# Patient Record
Sex: Male | Born: 1965 | Race: White | Hispanic: No | Marital: Married | Smoking: Never smoker
Health system: Southern US, Community
[De-identification: ages and names within clinical notes are randomized; demographics above are authoritative.]

---

## 2001-06-13 ENCOUNTER — Encounter: Payer: Self-pay | Admitting: Family Medicine

## 2001-06-13 ENCOUNTER — Ambulatory Visit (HOSPITAL_COMMUNITY): Admission: RE | Admit: 2001-06-13 | Discharge: 2001-06-13 | Payer: Self-pay | Admitting: Family Medicine

## 2003-08-14 ENCOUNTER — Ambulatory Visit (HOSPITAL_COMMUNITY): Admission: RE | Admit: 2003-08-14 | Discharge: 2003-08-14 | Payer: Self-pay | Admitting: Family Medicine

## 2003-08-14 ENCOUNTER — Encounter: Payer: Self-pay | Admitting: Family Medicine

## 2003-08-15 ENCOUNTER — Encounter: Payer: Self-pay | Admitting: Emergency Medicine

## 2003-08-16 ENCOUNTER — Inpatient Hospital Stay (HOSPITAL_COMMUNITY): Admission: AD | Admit: 2003-08-16 | Discharge: 2003-08-19 | Payer: Self-pay | Admitting: *Deleted

## 2003-08-18 ENCOUNTER — Encounter: Payer: Self-pay | Admitting: Internal Medicine

## 2007-07-10 ENCOUNTER — Emergency Department (HOSPITAL_COMMUNITY): Admission: EM | Admit: 2007-07-10 | Discharge: 2007-07-10 | Payer: Self-pay | Admitting: Emergency Medicine

## 2008-04-08 ENCOUNTER — Ambulatory Visit (HOSPITAL_COMMUNITY): Admission: RE | Admit: 2008-04-08 | Discharge: 2008-04-08 | Payer: Self-pay | Admitting: Family Medicine

## 2008-05-29 ENCOUNTER — Ambulatory Visit (HOSPITAL_COMMUNITY): Admission: RE | Admit: 2008-05-29 | Discharge: 2008-05-30 | Payer: Self-pay | Admitting: Neurosurgery

## 2008-06-22 ENCOUNTER — Ambulatory Visit (HOSPITAL_COMMUNITY): Admission: RE | Admit: 2008-06-22 | Discharge: 2008-06-22 | Payer: Self-pay | Admitting: Neurosurgery

## 2010-12-21 ENCOUNTER — Ambulatory Visit (HOSPITAL_COMMUNITY)
Admission: RE | Admit: 2010-12-21 | Discharge: 2010-12-21 | Payer: Self-pay | Source: Home / Self Care | Attending: Family Medicine | Admitting: Family Medicine

## 2011-01-14 ENCOUNTER — Encounter: Payer: Self-pay | Admitting: Orthopedic Surgery

## 2011-05-09 NOTE — Op Note (Signed)
NAMESHANON, BECVAR               ACCOUNT NO.:  [id number]   MEDICAL RECORD NO.:  [id number]          PATIENT TYPE:  OIB   LOCATION:  3528                         FACILITY:  [facility]   PHYSICIAN:  Hilda Lias, M.D.   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   PROCEDURE:  Right L5-S1 laminotomy, foraminotomy, diskectomy,  decompression of the S1 nerve root.  Microscope.   SURGEON:  Hilda Lias, MD.   ASSISTANT:  Cristi Loron, MD   CLINICAL HISTORY:  The patient was admitted because of back and right  leg pain.  The patient has failed every single conservative treatment.  Surgery was advised and the risks were explained to him and his wife.   PROCEDURE IN DETAIL:  The patient was taken to the OR.  He was  positioned in a prone manner.  X-rays showed the needle was quite below  the L5-S1 space.  The skin was cleaned with DuraPrep and midline  incision was made with muscle retracted to the right side.  Further x-  rays showed that indeed we were at the level of L5-S1.  With the  microscope, we drilled the lamina of L5 and the upper part of S1.  The  patient has a thick yellow ligament.  This was also excised.  Immediately, we found this S1 nerve root with placement of Depo-Medrol  solution from the plate.  The nerve root was swollen and it was very  attached to the floor and to the herniated disk.  Retraction was made  and immediately we excised a large free fragment.  There was an opening  in the disk space and we were able to dissipate.  Although, this  gentleman is 42 years, he has the consistency of a juvenile disk.  Removal of this loose fragment medial and lateral was achieved.  At the  end, we had good decompression of the  L5-S1 nerve root.  Valsalva  maneuver was negative.  Depo-Medrol and fentanyl were left in the  epidural space and the wound was closed with Vicryl and Steri-Strips.           ______________________________  Hilda Lias, M.D.     EB/MEDQ  D:  05/29/2008  T:  05/30/2008  Job:  [id number]

## 2011-05-09 NOTE — H&P (Signed)
NAMENEVAN, CREIGHTON               ACCOUNT NO.:  [id number]   MEDICAL RECORD NO.:  [id number]          PATIENT TYPE:  OIB   LOCATION:  3528                         FACILITY:  [facility]   PHYSICIAN:  Hilda Lias, M.D.   DATE OF BIRTH:  July 31, 1966   DATE OF ADMISSION:  05/29/2008  DATE OF DISCHARGE:                              HISTORY & PHYSICAL   Mr. Ryan Nichols is a gentleman who had been seen in my office on several  occasions complaining of back pain, radiates down to the right leg,  which is getting worse lately.  The patient had conservative treatments.  He is not any better.  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  He failed conservative  treatment, and he wanted to proceed with surgery.   PAST MEDICAL HISTORY:  Negative.   PAST SURGERIES:  Negative.   He is not allergic to any medication.   REVIEW OF SYSTEMS:  Positive for back and right leg pain.   PHYSICAL EXAMINATION:  The patient came to my office with his wife.  He  was limping on the right leg.  HEAD, EARS, NOSE, AND THROAT:  Normal.  NECK:  Normal.  LUNGS:  Clear.  HEART:  Sounds normal.  ABDOMEN:  Normal.  EXTREMITIES:  Normal pulses.  NEUROLOGIC:  He has absent right ankle jerk.  He has a weakness of  dorsiflexion to right foot 4/5 with positive plantar flexion.  On distal  straight leg raising, is positive at about 45 degrees.    MRI showed a herniated disk at the level L5-S1 central and to the right  side.   CLINICAL IMPRESSION:  Right L5-S1 herniated disk with an incidental  foraminal narrowing at the L4-5 on the left.   RECOMMENDATIONS:  The patient wanted to proceed with surgery.  The  patient asked me about doing not only the right L5-S1 herniated disk,  but also to take care of the left side L4-5.  He told that since he did  have no  symptoms on the left side, we better stay away from  any procedure in  that area, which is not producing him any pain.  The procedure will be  right L5-S1 diskectomy.  The risks were fully explained to him including  the possibility of infection, CSF leak, worsening pain, paralysis, and  need for further surgery.           ______________________________  Hilda Lias, M.D.     EB/MEDQ  D:  05/29/2008  T:  05/30/2008  Job:  [id number]

## 2011-09-21 LAB — CBC
MCV: 90.5
RBC: 4.87
WBC: 6.3

## 2011-10-09 LAB — COMPREHENSIVE METABOLIC PANEL
ALT: 23
AST: 29
Albumin: 4.1
CO2: 26
Calcium: 9.2
Chloride: 108
GFR calc Af Amer: >60
GFR calc non Af Amer: >60
Sodium: 138
Total Bilirubin: 1.2

## 2011-10-09 LAB — DIFFERENTIAL
Basophils Absolute: 0
Basophils Relative: 0
Eosinophils Absolute: 0.2
Eosinophils Relative: 3
Lymphocytes Relative: 15
Lymphs Abs: 0.9
Monocytes Absolute: 0.3
Monocytes Relative: 4
Neutro Abs: 4.6
Neutrophils Relative %: 78 — ABNORMAL HIGH

## 2011-10-09 LAB — COMPREHENSIVE METABOLIC PANEL WITH GFR
Alkaline Phosphatase: 64
BUN: 14
Creatinine, Ser: 1.26
Glucose, Bld: 134 — ABNORMAL HIGH
Potassium: 3.8
Total Protein: 6.4

## 2011-10-09 LAB — URINALYSIS, ROUTINE W REFLEX MICROSCOPIC
Bilirubin Urine: NEGATIVE
Glucose, UA: NEGATIVE
Ketones, ur: 15 — AB
Leukocytes, UA: NEGATIVE
Nitrite: NEGATIVE
Protein, ur: NEGATIVE
Specific Gravity, Urine: >1.03 — ABNORMAL HIGH
Urobilinogen, UA: 0.2
pH: 5.5

## 2011-10-09 LAB — LIPASE, BLOOD: Lipase: 27

## 2011-10-09 LAB — CBC
HCT: 39.2
Hemoglobin: 13.4
MCHC: 34.3
MCV: 90.3
Platelets: 243
RBC: 4.34
RDW: 12.2
WBC: 5.9

## 2011-10-09 LAB — URINE MICROSCOPIC-ADD ON

## 2017-06-20 ENCOUNTER — Ambulatory Visit (INDEPENDENT_AMBULATORY_CARE_PROVIDER_SITE_OTHER): Payer: BC Managed Care – PPO | Admitting: Family Medicine

## 2017-06-20 ENCOUNTER — Encounter: Payer: Self-pay | Admitting: Family Medicine

## 2017-06-20 VITALS — BP 118/84 | Temp 98.6°F | Ht 72.0 in | Wt 209.6 lb

## 2017-06-20 DIAGNOSIS — J329 Chronic sinusitis, unspecified: Secondary | ICD-10-CM

## 2017-06-20 DIAGNOSIS — J208 Acute bronchitis due to other specified organisms: Secondary | ICD-10-CM | POA: Diagnosis not present

## 2017-06-20 DIAGNOSIS — J310 Chronic rhinitis: Secondary | ICD-10-CM

## 2017-06-20 MED ORDER — DOXYCYCLINE HYCLATE 100 MG PO TABS: 100.0000 mg | ORAL_TABLET | Freq: Two times a day (BID) | ORAL | 0 refills | Status: DC

## 2017-06-20 MED ORDER — HYDROCODONE-HOMATROPINE 5-1.5 MG/5ML PO SYRP: 5.0000 mL | ORAL_SOLUTION | Freq: Every evening | ORAL | 0 refills | Status: DC | PRN

## 2017-06-20 NOTE — Progress Notes (Signed)
   Subjective:    Patient ID: Ryan Nichols, male    DOB: 12/26/1965, 51 y.o.   MRN: [id number]  Cough  This is a new problem. The current episode started in the past 7 days. Associated symptoms include a fever, headaches, nasal congestion and a sore throat. Associated symptoms comments: Abdominal pain. Treatments tried: allergy meds.   Pt d3veloped cough pretty severe  Dim energy  Wiped out  Felt feveriish   Throat inflammed all day  Non productive for everything using alergy meds      Review of Systems  Constitutional: Positive for fever.  HENT: Positive for sore throat.   Respiratory: Positive for cough.   Neurological: Positive for headaches.       Objective:   Physical Exam Alert, mild malaise. Hydration good Vitals stable. frontal/ maxillary tenderness evident positive nasal congestion. pharynx normal neck supple  lungs clear/no crackles or wheezes. heart regular in rhythm  Left lateral and lower abdominal tenderness to deep palpation. Excellent bowel sounds no discrete masses no guarding no rebound pain worse with movement and coughing      Assessment & Plan:  Impression rhinosinusitis likely post viral, discussed with patient. plan antibiotics prescribed. Questions answered. Symptomatic care discussed. warning signs discussed.Also likely element of bronchitis and abdominal strain from all of the coughing. All of this likely represents December time viral syndrome with potentially secondary bacterial infection. Doubt tickborne illness but will cover discussed WSL also strongly encouraged yearly preventive checkups

## 2017-06-22 ENCOUNTER — Telehealth: Payer: Self-pay | Admitting: Family Medicine

## 2017-06-22 MED ORDER — BENZONATATE 100 MG PO CAPS: 100.0000 mg | ORAL_CAPSULE | Freq: Three times a day (TID) | ORAL | 0 refills | Status: DC | PRN

## 2017-06-22 NOTE — Telephone Encounter (Signed)
Spoke with patient and patient stated that hycodan is not helping his cough and it is not helping him rest. He is still coughing really hard during the night. He also has concerns of strained feeling that feels like a pulled muscle to the abdomen that patient feels is associated with cough. Please advise?

## 2017-06-22 NOTE — Telephone Encounter (Signed)
Patient was prescribed hycodan syrup to help him sleep on 06/20/17 by Dr. Brett CanalesSteve.  He is having an adverse reaction and it is actually keeping him awake.  Please advise.

## 2017-06-22 NOTE — Telephone Encounter (Signed)
Off medication is as strong as it gets. May add Tessalon 100 mg 1 3 times a day when necessary cough, #21. If the patient is having shortness of breath fever chills may need to have x-ray if worse will need follow-up

## 2017-06-22 NOTE — Telephone Encounter (Signed)
Spoke with patient's wife and informed her per Dr.Scott Luking- As far as cough medication Hycodan is as strong as it gets. We may add Tessalon perles 100 mg by mouth 1 3 times a day when necessary for cough . If he has SOB, Fever, chills may need to have xray if worse will need to follow up. Patient's wife verbalized understanding.

## 2017-06-26 ENCOUNTER — Ambulatory Visit (INDEPENDENT_AMBULATORY_CARE_PROVIDER_SITE_OTHER): Payer: BC Managed Care – PPO | Admitting: Family Medicine

## 2017-06-26 ENCOUNTER — Encounter: Payer: Self-pay | Admitting: Family Medicine

## 2017-06-26 ENCOUNTER — Ambulatory Visit (HOSPITAL_COMMUNITY)
Admission: RE | Admit: 2017-06-26 | Discharge: 2017-06-26 | Disposition: A | Discharge status: Home / Self Care | Payer: BC Managed Care – PPO | Source: Ambulatory Visit | Attending: Family Medicine | Admitting: Family Medicine

## 2017-06-26 VITALS — BP 132/88 | Temp 98.5°F | Ht 72.0 in | Wt 209.0 lb

## 2017-06-26 DIAGNOSIS — J208 Acute bronchitis due to other specified organisms: Secondary | ICD-10-CM | POA: Diagnosis not present

## 2017-06-26 DIAGNOSIS — R5383 Other fatigue: Secondary | ICD-10-CM | POA: Diagnosis not present

## 2017-06-26 DIAGNOSIS — R05 Cough: Secondary | ICD-10-CM | POA: Insufficient documentation

## 2017-06-26 DIAGNOSIS — R0609 Other forms of dyspnea: Secondary | ICD-10-CM | POA: Insufficient documentation

## 2017-06-26 MED ORDER — BENZONATATE 100 MG PO CAPS: 100.0000 mg | ORAL_CAPSULE | Freq: Three times a day (TID) | ORAL | 0 refills | Status: DC | PRN

## 2017-06-26 MED ORDER — LEVOFLOXACIN 500 MG PO TABS: 500.0000 mg | ORAL_TABLET | Freq: Every day | ORAL | 0 refills | Status: DC

## 2017-06-26 NOTE — Progress Notes (Signed)
   Subjective:    Patient ID: Ryan Nichols, male    DOB: 08/04/1966, 51 y.o.   MRN: [id number]  Sinusitis  This is a new problem. Episode onset: 10 days. Associated symptoms include congestion, coughing and a sore throat. Pertinent negatives include no ear pain. Treatments tried: allergy, cough meds.  Patient relates a lot of fatigue tiredness feeling rundown he also relates a little bit of shortness of breath when he pushes himself he denies chest pressure tightness pain denies sweats or chills he was having fever chills body aches headache cough congestion now is having mainly hoarseness and coughing  Review of Systems  Constitutional: Negative for activity change and fever.  HENT: Positive for congestion, rhinorrhea and sore throat. Negative for ear pain.   Eyes: Negative for discharge.  Respiratory: Positive for cough. Negative for wheezing.   Cardiovascular: Negative for chest pain.       Objective:   Physical Exam  Constitutional: He appears well-developed.  HENT:  Head: Normocephalic.  Mouth/Throat: Oropharynx is clear and moist. No oropharyngeal exudate.  Neck: Normal range of motion.  Cardiovascular: Normal rate, regular rhythm and normal heart sounds.   No murmur heard. Pulmonary/Chest: Effort normal and breath sounds normal. He has no wheezes.  Lymphadenopathy:    He has no cervical adenopathy.  Neurological: He exhibits normal muscle tone.  Skin: Skin is warm and dry.  Nursing note and vitals reviewed.         Assessment & Plan:  Chest x-ray indicated new work Lab work indicated If progressive symptoms or worse follow-up Change antibiotics Tessalon as needed for cough If not getting better over the next 10 days to call

## 2017-06-27 LAB — CBC WITH DIFFERENTIAL/PLATELET
BASOS ABS: 0.1 10*3/uL (ref 0.0–0.2)
Basos: 1 %
EOS (ABSOLUTE): 0.4 10*3/uL (ref 0.0–0.4)
Eos: 6 %
Hematocrit: 44.9 % (ref 37.5–51.0)
Hemoglobin: 15.6 g/dL (ref 13.0–17.7)
Immature Grans (Abs): 0 10*3/uL (ref 0.0–0.1)
Immature Granulocytes: 0 %
LYMPHS ABS: 1.7 10*3/uL (ref 0.7–3.1)
Lymphs: 28 %
MCH: 30 pg (ref 26.6–33.0)
MCHC: 34.7 g/dL (ref 31.5–35.7)
MCV: 86 fL (ref 79–97)
MONOCYTES: 8 %
MONOS ABS: 0.5 10*3/uL (ref 0.1–0.9)
Neutrophils Absolute: 3.6 10*3/uL (ref 1.4–7.0)
Neutrophils: 57 %
PLATELETS: 351 10*3/uL (ref 150–379)
RBC: 5.2 x10E6/uL (ref 4.14–5.80)
RDW: 13.1 % (ref 12.3–15.4)
WBC: 6.3 10*3/uL (ref 3.4–10.8)

## 2017-06-27 LAB — BASIC METABOLIC PANEL
BUN/Creatinine Ratio: 15 (ref 9–20)
BUN: 16 mg/dL (ref 6–24)
CHLORIDE: 100 mmol/L (ref 96–106)
CO2: 26 mmol/L (ref 20–29)
Calcium: 10 mg/dL (ref 8.7–10.2)
Creatinine, Ser: 1.06 mg/dL (ref 0.76–1.27)
GFR calc Af Amer: 92 mL/min/{1.73_m2} (ref >59)
GFR calc non Af Amer: 80 mL/min/{1.73_m2} (ref >59)
GLUCOSE: 101 mg/dL — AB (ref 65–99)
POTASSIUM: 4.6 mmol/L (ref 3.5–5.2)
SODIUM: 143 mmol/L (ref 134–144)

## 2018-04-05 ENCOUNTER — Encounter: Payer: Self-pay | Admitting: Family Medicine

## 2018-04-05 ENCOUNTER — Ambulatory Visit: Payer: BC Managed Care – PPO | Admitting: Family Medicine

## 2018-04-05 VITALS — BP 138/90 | Temp 98.5°F | Ht 72.0 in | Wt 218.0 lb

## 2018-04-05 DIAGNOSIS — Z23 Encounter for immunization: Secondary | ICD-10-CM | POA: Diagnosis not present

## 2018-04-05 DIAGNOSIS — T148XXA Other injury of unspecified body region, initial encounter: Secondary | ICD-10-CM

## 2018-04-05 DIAGNOSIS — S40021A Contusion of right upper arm, initial encounter: Secondary | ICD-10-CM

## 2018-04-05 DIAGNOSIS — M79601 Pain in right arm: Secondary | ICD-10-CM

## 2018-04-05 DIAGNOSIS — S51831A Puncture wound without foreign body of right forearm, initial encounter: Secondary | ICD-10-CM

## 2018-04-05 NOTE — Progress Notes (Signed)
   Subjective:    Patient ID: Ryan Nichols, male    DOB: 05/04/1966, 52 y.o.   MRN: [id number]  HPINail punctured through right forearm 3 days ago. Pt states last tetanus vaccine was 15 - 20 years ago.   Occurred tue nd puctured arm   dveloed deep  Wound   no fever no chills.  No discharge at this time.  Quite a bit of bleeding at the time of injury.  Patient does work outdoors walking around nails  A little tend to palp     Review of Systems No headache, no major weight loss or weight gain, no chest pain no back pain abdominal pain no change in bowel habits complete ROS otherwise negative     Objective:   Physical Exam  Alert vitals stable, NAD. Blood pressure good on repeat. HEENT normal. Lungs clear. Heart regular rate and rhythm. Right arm contusion along with puncture wound expected tenderness.  No obvious discharge or signs of infection      Assessment & Plan:  Impression puncture wound discussed.  Warning signs discussed.  Tetanus shot today.  Encouraged to get wellness exam.  Briefly described the patient questions answered

## 2018-04-05 NOTE — Patient Instructions (Signed)
Consider wellness exam/discussion with Dr Lorin PicketScott  Watch for signs of infection

## 2019-03-11 ENCOUNTER — Emergency Department (HOSPITAL_COMMUNITY): Payer: BC Managed Care – PPO

## 2019-03-11 ENCOUNTER — Encounter (HOSPITAL_COMMUNITY): Payer: Self-pay | Admitting: Emergency Medicine

## 2019-03-11 ENCOUNTER — Other Ambulatory Visit: Payer: Self-pay

## 2019-03-11 ENCOUNTER — Emergency Department (HOSPITAL_COMMUNITY)
Admission: EM | Admit: 2019-03-11 | Discharge: 2019-03-11 | Disposition: A | Discharge status: Home / Self Care | Payer: BC Managed Care – PPO | Attending: Emergency Medicine | Admitting: Emergency Medicine

## 2019-03-11 DIAGNOSIS — S71111A Laceration without foreign body, right thigh, initial encounter: Secondary | ICD-10-CM | POA: Insufficient documentation

## 2019-03-11 DIAGNOSIS — S81811A Laceration without foreign body, right lower leg, initial encounter: Secondary | ICD-10-CM

## 2019-03-11 DIAGNOSIS — Y9289 Other specified places as the place of occurrence of the external cause: Secondary | ICD-10-CM | POA: Insufficient documentation

## 2019-03-11 DIAGNOSIS — W208XXA Other cause of strike by thrown, projected or falling object, initial encounter: Secondary | ICD-10-CM | POA: Diagnosis not present

## 2019-03-11 DIAGNOSIS — Y9389 Activity, other specified: Secondary | ICD-10-CM | POA: Insufficient documentation

## 2019-03-11 DIAGNOSIS — Y998 Other external cause status: Secondary | ICD-10-CM | POA: Insufficient documentation

## 2019-03-11 DIAGNOSIS — S79921A Unspecified injury of right thigh, initial encounter: Secondary | ICD-10-CM | POA: Diagnosis present

## 2019-03-11 MED ORDER — LIDOCAINE-EPINEPHRINE-TETRACAINE (LET) SOLUTION
3.0000 mL | Freq: Once | NASAL | Status: AC
  Administered 2019-03-11: 3 mL via TOPICAL
  Filled 2019-03-11: qty 3

## 2019-03-11 MED ORDER — LIDOCAINE HCL (PF) 1 % IJ SOLN
10.0000 mL | Freq: Once | INTRAMUSCULAR | Status: AC
  Administered 2019-03-11: 10 mL

## 2019-03-11 MED ORDER — POVIDONE-IODINE 10 % EX SOLN
CUTANEOUS | Status: AC
  Filled 2019-03-11: qty 15

## 2019-03-11 MED ORDER — BACITRACIN-NEOMYCIN-POLYMYXIN 400-5-5000 EX OINT
TOPICAL_OINTMENT | Freq: Once | CUTANEOUS | Status: AC
  Administered 2019-03-11: 1 via TOPICAL
  Filled 2019-03-11: qty 1

## 2019-03-11 MED ORDER — HYDROCODONE-ACETAMINOPHEN 5-325 MG PO TABS
1.0000 | ORAL_TABLET | Freq: Once | ORAL | Status: AC
  Administered 2019-03-11: 1 via ORAL
  Filled 2019-03-11: qty 1

## 2019-03-11 MED ORDER — LIDOCAINE HCL (PF) 1 % IJ SOLN
INTRAMUSCULAR | Status: AC
  Administered 2019-03-11: 10 mL
  Filled 2019-03-11: qty 10

## 2019-03-11 MED ORDER — LIDOCAINE HCL (PF) 1 % IJ SOLN
INTRAMUSCULAR | Status: AC
  Filled 2019-03-11: qty 5

## 2019-03-11 MED ORDER — POVIDONE-IODINE 10 % EX SOLN
CUTANEOUS | Status: DC | PRN
  Administered 2019-03-11: 1 via TOPICAL

## 2019-03-11 MED ORDER — HYDROCODONE-ACETAMINOPHEN 5-325 MG PO TABS
1.0000 | ORAL_TABLET | ORAL | 0 refills | Status: AC | PRN
Start: 2019-03-11 — End: ?

## 2019-03-11 NOTE — ED Provider Notes (Signed)
[facility] EMERGENCY DEPARTMENT Provider Note   CSN: [id number] Arrival date & time: 03/11/19  1559    History   Chief Complaint Chief Complaint  Patient presents with  . Laceration    HPI Ryan Nichols is a 53 y.o. male.     The history is provided by the patient.  Laceration  Location:  Leg Leg laceration location:  R upper leg Length:  #5 lacerations, 3 corner tears uniformely across mid thigh, largest measuring 2 cm in greatest diameter Depth:  Through dermis Bleeding: controlled   Time since incident:  30 minutes Injury mechanism: a #400 pound metal grate was being slid off a truck. His partner dropped it and the metal ends lacerated his leg.  It did not fall fully on his leg. Pain details:    Quality:  Aching   Severity:  Moderate Foreign body present:  No foreign bodies Relieved by:  Nothing Worsened by:  Nothing Tetanus status:  Up to date Associated symptoms: no fever, no numbness and no rash     History reviewed. No pertinent past medical history.  There are no active problems to display for this patient.   History reviewed. No pertinent surgical history.      Home Medications    Prior to Admission medications   Medication Sig Start Date End Date Taking? Authorizing Provider  benzonatate (TESSALON PERLES) 100 MG capsule Take 1 capsule (100 mg total) by mouth 3 (three) times daily as needed for cough. Patient not taking: Reported on 04/05/2018 06/26/17   Babs Sciara, MD  HYDROcodone-acetaminophen (NORCO/VICODIN) 5-325 MG tablet Take 1 tablet by mouth every 4 (four) hours as needed. 03/11/19   Alvenia Treese, Raynelle Fanning, PA-C  levofloxacin (LEVAQUIN) 500 MG tablet Take 1 tablet (500 mg total) by mouth daily. Patient not taking: Reported on 04/05/2018 06/26/17   Babs Sciara, MD    Family History No family history on file.  Social History Social History   Tobacco Use  . Smoking status: Never Smoker  . Smokeless tobacco: Never Used  Substance Use Topics   . Alcohol use: Never    Frequency: Never  . Drug use: Never     Allergies   Patient has no known allergies.   Review of Systems Review of Systems  Constitutional: Negative for fever.  HENT: Negative for congestion and sore throat.   Eyes: Negative.   Respiratory: Negative for chest tightness and shortness of breath.   Cardiovascular: Negative for chest pain.  Gastrointestinal: Negative for abdominal pain and nausea.  Genitourinary: Negative.   Musculoskeletal: Positive for arthralgias. Negative for joint swelling and neck pain.  Skin: Positive for wound. Negative for rash.  Neurological: Negative for dizziness, weakness, light-headedness, numbness and headaches.  Psychiatric/Behavioral: Negative.      Physical Exam Updated Vital Signs BP (!) 150/101 (BP Location: Right Arm)   Pulse (!) 59   Temp 98.3 F (36.8 C) (Oral)   Resp 18   Ht 6' (1.829 m)   Wt 97.5 kg   SpO2 99%   BMI 29.16 kg/m   Physical Exam Constitutional:      Appearance: He is well-developed.  HENT:     Head: Atraumatic.  Neck:     Musculoskeletal: Normal range of motion.  Cardiovascular:     Comments: Pulses equal bilaterally Musculoskeletal:        General: Swelling, tenderness and signs of injury present. No deformity.     Comments: ttp and soft edema mid right anterior thigh surrounding  site of multiple lacerations.  Skin:    General: Skin is warm and dry.     Comments: #5 flap lacerations across mid right thigh, hemostatic, 3 through the dermis but not involving deeper structures, 2 superficial, largest measuring 3 cm in length. Total sutured area approx 11 cm  Neurological:     Mental Status: He is alert.     Sensory: No sensory deficit.     Deep Tendon Reflexes: Reflexes normal.      ED Treatments / Results  Labs (all labs ordered are listed, but only abnormal results are displayed) Labs Reviewed - No data to display  EKG None  Radiology Dg Femur Min 2 Views Right  Result  Date: 03/11/2019 CLINICAL DATA:  Right thigh laceration. EXAM: RIGHT FEMUR 2 VIEWS COMPARISON:  None. 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 No fracture or AVN. The right femur is intact. No femur fracture or bone lesion. Mild ridge of slight bony prominence in the proximal femurs likely related to a muscle attachment. There is a soft tissue defect involving the upper right lateral thigh but no radiopaque foreign body is identified. IMPRESSION: No fracture or radiopaque foreign body. Electronically Signed   By: Rudie Meyer M.D.   On: 03/11/2019 17:04    Procedures Procedures (including critical care time)  LACERATION REPAIR Performed by: Burgess Amor Authorized by: Burgess Amor Consent: Verbal consent obtained. Risks and benefits: risks, benefits and alternatives were discussed Consent given by: patient Patient identity confirmed: provided demographic data Prepped and Draped in normal sterile fashion Wound explored  Laceration Location: right anterior mid thigh  Laceration Length: 11 cm  No Foreign Bodies seen or palpated  Anesthesia: local infiltration  Local anesthetic: lidocaine 1% without epinephrine 12 cc, after topical LET application  Anesthetic total: 12 ml  Irrigation method: syringe Amount of cleaning: standard  Skin closure: ethilon 4-0   Number of sutures: 18  Technique: simple interrupted  Patient tolerance: Patient tolerated the procedure well with no immediate complications.   Medications Ordered in ED Medications  povidone-iodine (BETADINE) 10 % external solution (1 application Topical Given 03/11/19 1647)  neomycin-bacitracin-polymyxin (NEOSPORIN) ointment packet (has no administration in time range)  lidocaine-EPINEPHrine-tetracaine (LET) solution (3 mLs Topical Given 03/11/19 1644)  lidocaine (PF) (XYLOCAINE) 1 % injection 10 mL (10 mLs Other Given  03/11/19 1646)  HYDROcodone-acetaminophen (NORCO/VICODIN) 5-325 MG per tablet 1 tablet (1 tablet Oral Given 03/11/19 1718)     Initial Impression / Assessment and Plan / ED Course  I have reviewed the triage vital signs and the nursing notes.  Pertinent labs & imaging results that were available during my care of the patient were reviewed by me and considered in my medical decision making (see chart for details).        Wound care instructions given.  Pt advised to have sutures removed in 12 days,  Return here sooner for any signs of infection including redness, swelling, worse pain or drainage of pus.  Also instructed on signs/sx of compartment sx and recheck for any such sx.  Ice,  Elevation.  Pt is current with tetanus.     Final Clinical Impressions(s) / ED Diagnoses   Final diagnoses:  Laceration of right lower extremity, initial encounter    ED Discharge Orders         Ordered    HYDROcodone-acetaminophen (NORCO/VICODIN) 5-325 MG tablet  Every 4 hours PRN  03/11/19 1816           Burgess Amor, PA-C 03/11/19 1821    Eber Hong, MD 03/11/19 [phone]

## 2019-03-11 NOTE — Discharge Instructions (Signed)
Have your sutures removed in 12 days.  Keep your wound clean and dry for the first 2 days until a good scab forms, I suggest applying a layer of antibiotic ointment or vaseline to keep the water from penetrating while in the shower the first 2 days.  You may then wash gently twice daily with mild soap and water, but dry completely after.  Get rechecked for any sign of infection (redness,  Swelling,  Increased pain or drainage of purulent fluid).  Also get rechecked if you have worsening pain, hardness of your skin as discussed or numbness, paleness or weakness in your leg below the level of your injury.

## 2019-03-11 NOTE — ED Triage Notes (Signed)
Lac to top of RT leg from metal grate

## 2019-03-20 ENCOUNTER — Telehealth: Payer: Self-pay | Admitting: Family Medicine

## 2019-03-20 MED ORDER — CEPHALEXIN 500 MG PO CAPS: ORAL_CAPSULE | ORAL | 0 refills | Status: AC

## 2019-03-20 NOTE — Telephone Encounter (Signed)
No drainage. Could be red due to jeans rubbing the area. Does wear shorts at night and in the morning the color is not as red. No swollen but is tender. No fever or any other symptoms. (Happened last week) Pt is supposed to get stitches out in 12 days, please advise. Thank you

## 2019-03-20 NOTE — Telephone Encounter (Signed)
Patient has 18 stitches on thigh and redness appeared Monday and is starting to feel tender to touch, would like medication called in if possible, no fever. Advise.     Pharmacy:  [facility] - [place], [place] - [address]

## 2019-03-20 NOTE — Telephone Encounter (Signed)
Typically stitches are kept in for 12 to 14 days for this type of laceration May call in prescription of Keflex 500 mg 1 4 times daily for the next 10 days If progressive redness or tenderness or other issues would need to be seen Also recommend for the patient to follow-up when it has been 2 weeks to have sutures removed

## 2019-03-20 NOTE — Telephone Encounter (Signed)
Medication sent in and patient verbalized understanding. Pt transferred up front to set up appt

## 2019-03-24 ENCOUNTER — Other Ambulatory Visit: Payer: Self-pay

## 2019-03-24 ENCOUNTER — Encounter: Payer: Self-pay | Admitting: Family Medicine

## 2019-03-24 ENCOUNTER — Ambulatory Visit (INDEPENDENT_AMBULATORY_CARE_PROVIDER_SITE_OTHER): Payer: BC Managed Care – PPO | Admitting: Family Medicine

## 2019-03-24 VITALS — BP 132/80 | Temp 98.1°F | Ht 72.0 in | Wt 217.0 lb

## 2019-03-24 DIAGNOSIS — M79661 Pain in right lower leg: Secondary | ICD-10-CM

## 2019-03-24 DIAGNOSIS — S81811D Laceration without foreign body, right lower leg, subsequent encounter: Secondary | ICD-10-CM

## 2019-03-24 NOTE — Progress Notes (Signed)
   Subjective:    Patient ID: Ryan Nichols, male    DOB: 13-Nov-1966, 53 y.o.   MRN: [id number]  HPI pt states NO cough, fever, sob, or body aches.   pt arrives today for suture removal. Went to [facility] ed on 03/11/19. Copied ed note below. Keflex called in on 03/20/19.   Location:  Leg Leg laceration location:  R upper leg Length:  #5 lacerations, 3 corner tears uniformely across mid thigh, largest measuring 2 cm in greatest diameter Depth:  Through dermis Bleeding: controlled   Time since incident:  30 minutes Injury mechanism: a #400 pound metal grate was being slid off a truck. His partner dropped it and the metal ends lacerated his leg.  It did not fall fully on his leg.    Review of Systems     Objective:   Physical Exam  Some of the patient's sutures have come out on their own Others have scabs around them They were carefully removed without difficulty There were no other apparent sutures left No sign of infection.      Assessment & Plan:  Multiple leg laceration sutures removed without difficulty follow-up if problems

## 2020-11-26 ENCOUNTER — Ambulatory Visit: Payer: BC Managed Care – PPO | Admitting: Family Medicine

## 2021-03-10 IMAGING — DX RIGHT FEMUR 2 VIEWS
4 series · 4 of 4 positions shown · non-contrast
Comparison: None.

CLINICAL DATA: Right thigh laceration.

EXAM:
RIGHT FEMUR 2 VIEWS

[femur ap (1 of 2)]
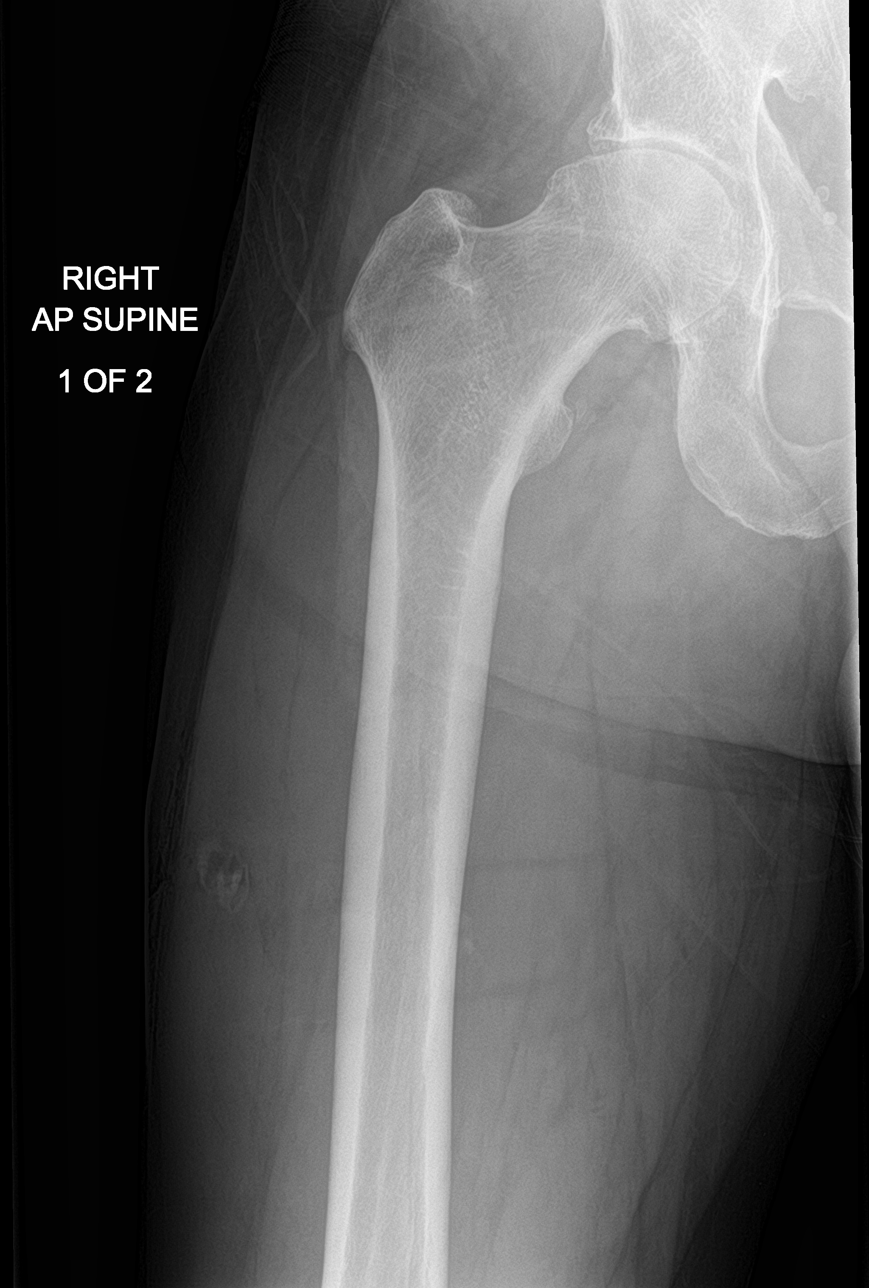

[femur ap (2 of 2)]
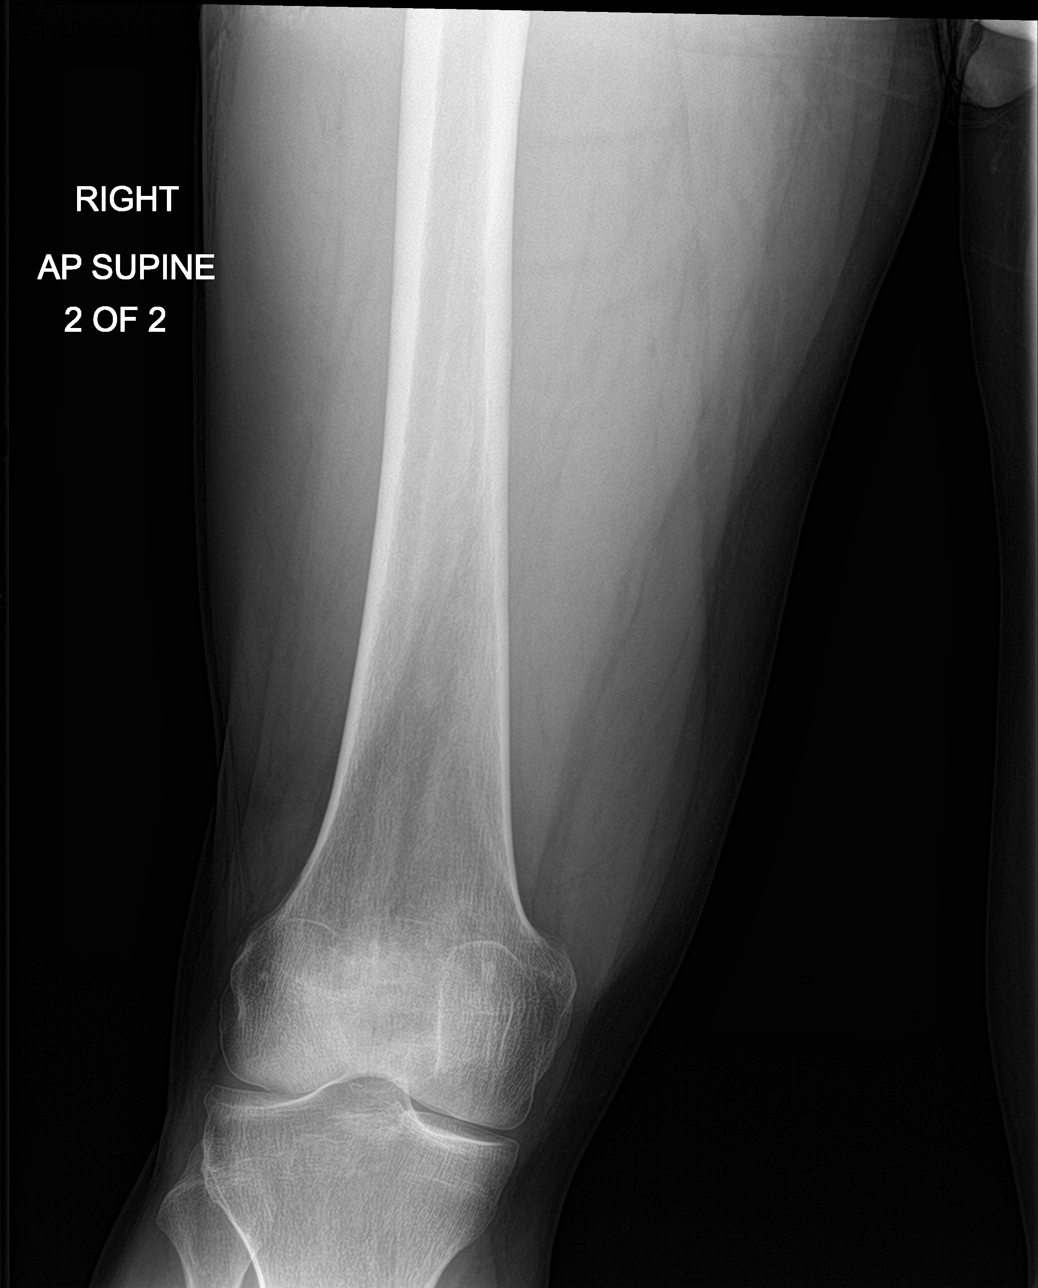

[femur lat (1 of 2)]
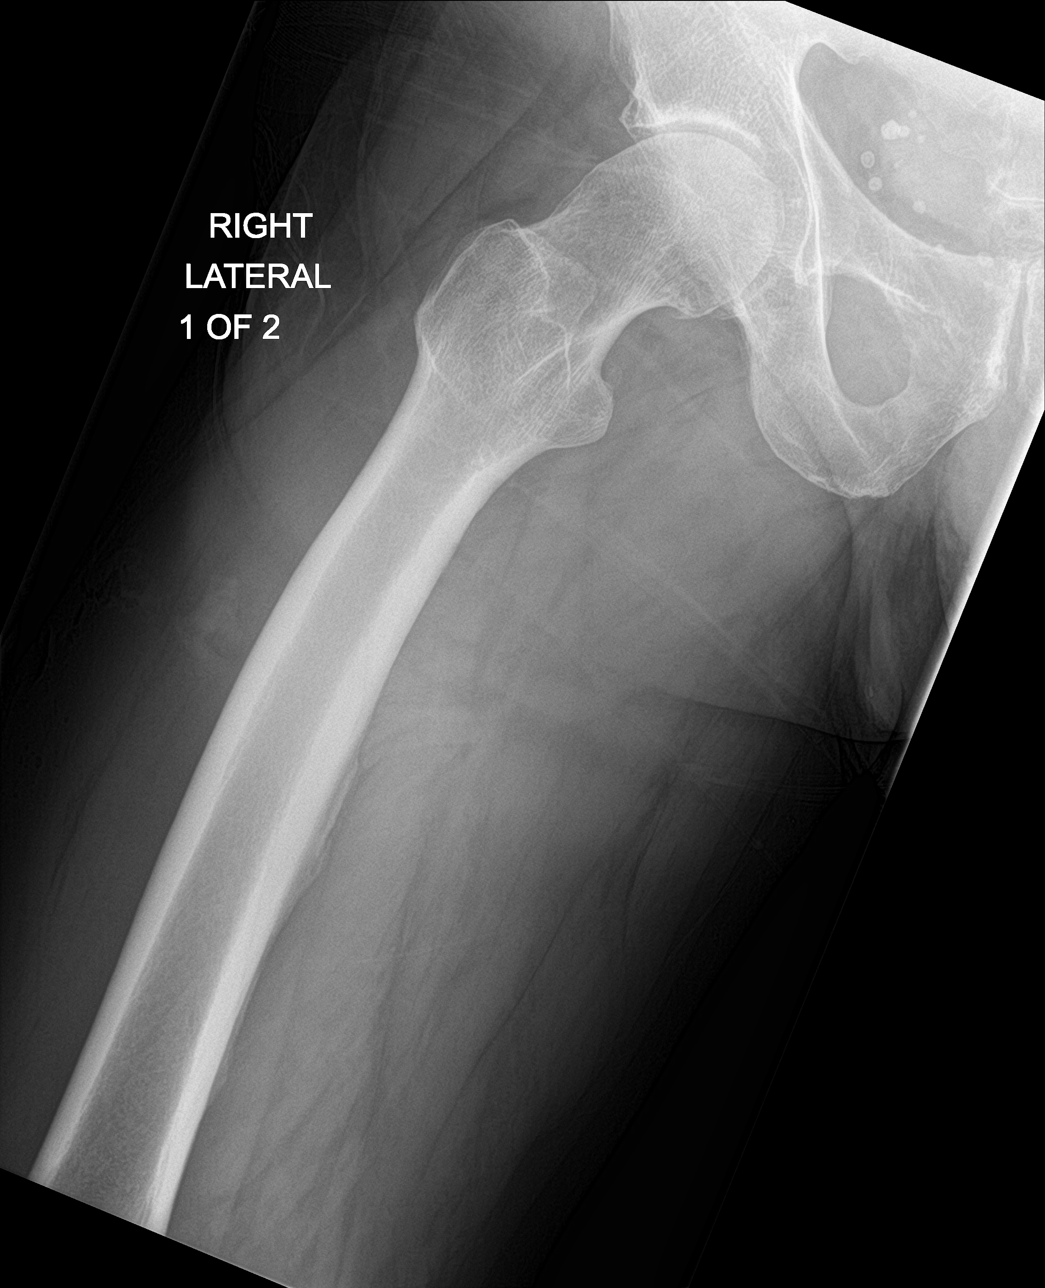

[femur lat (2 of 2)]
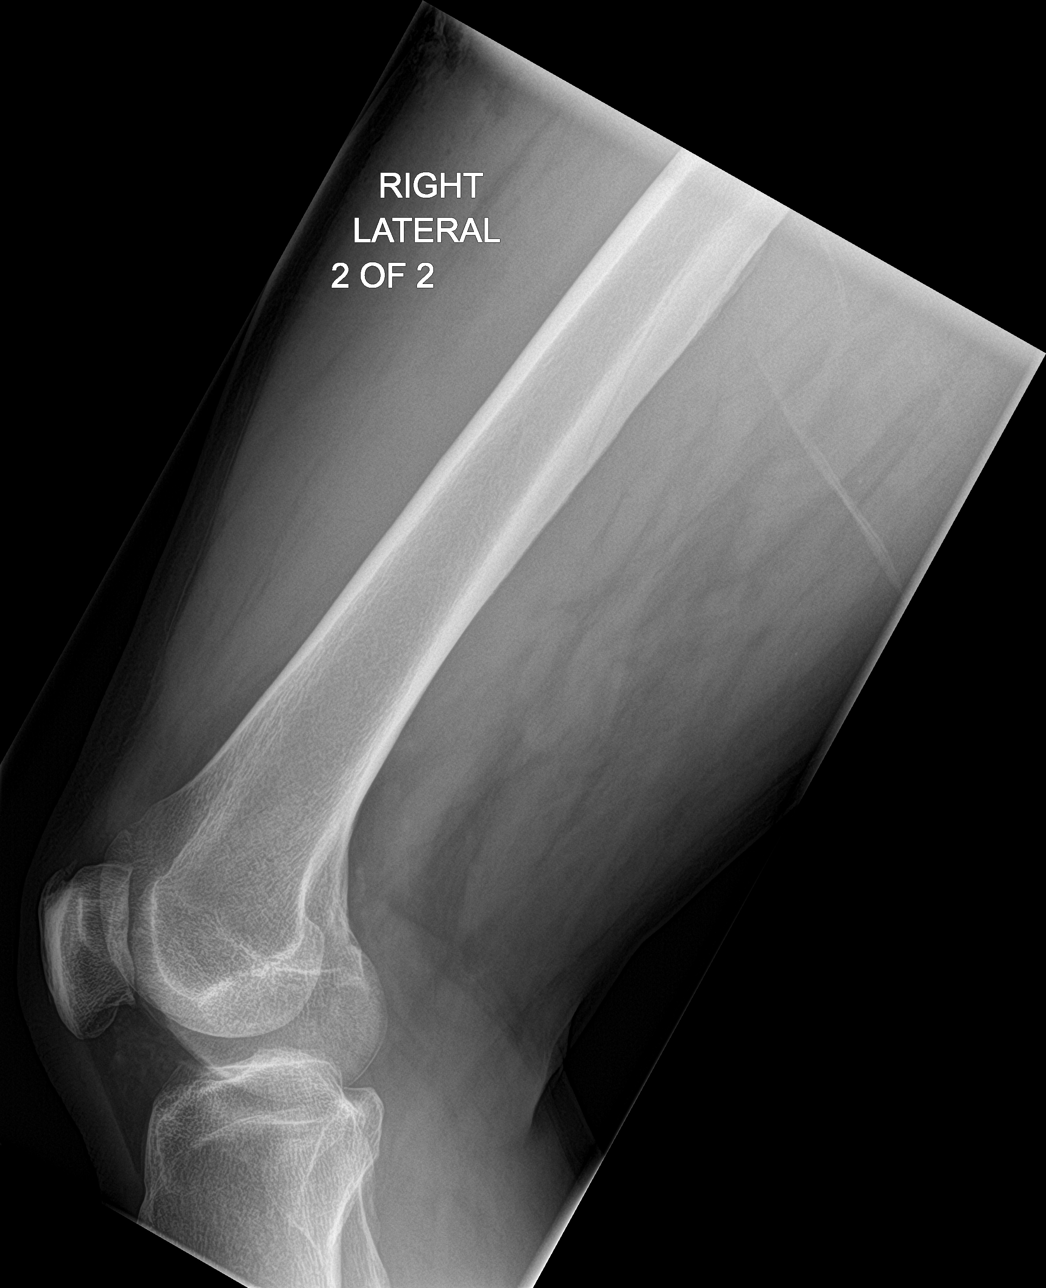

[4 of 4 positions shown; findings below may reference images not displayed]

FINDINGS: Moderate hip joint degenerative changes for the patient's age with
probable mild congenital dysplasia and a mild coxa magna deformity.
No fracture or AVN.

The right femur is intact. No femur fracture or bone lesion. Mild
ridge of slight bony prominence in the proximal femurs likely
related to a muscle attachment.

There is a soft tissue defect involving the upper right lateral
thigh but no radiopaque foreign body is identified.
IMPRESSION: No fracture or radiopaque foreign body.
